# Patient Record
Sex: Male | Born: 2005 | Race: Asian | Hispanic: No | Marital: Single
Health system: Southern US, Community
[De-identification: ages and names within clinical notes are randomized; demographics above are authoritative.]

---
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
## 2023-09-13 DIAGNOSIS — Z13220 Encounter for screening for lipoid disorders: Secondary | ICD-10-CM | POA: Diagnosis not present

## 2023-09-13 DIAGNOSIS — Z0000 Encounter for general adult medical examination without abnormal findings: Secondary | ICD-10-CM | POA: Diagnosis not present

## 2023-09-22 ENCOUNTER — Ambulatory Visit: Payer: Self-pay | Admitting: Nurse Practitioner

## 2023-09-22 DIAGNOSIS — Z0000 Encounter for general adult medical examination without abnormal findings: Secondary | ICD-10-CM | POA: Diagnosis not present
